# Patient Record
Sex: Male | Born: 1985 | Race: Black or African American | Hispanic: No | Marital: Married | Smoking: Current some day smoker
Health system: Southern US, Community
[De-identification: ages and names within clinical notes are randomized; demographics above are authoritative.]

---

## 2009-03-28 ENCOUNTER — Emergency Department (HOSPITAL_COMMUNITY): Admission: EM | Admit: 2009-03-28 | Discharge: 2009-03-28 | Payer: Self-pay | Admitting: Emergency Medicine

## 2020-06-20 ENCOUNTER — Emergency Department (HOSPITAL_BASED_OUTPATIENT_CLINIC_OR_DEPARTMENT_OTHER): Payer: No Typology Code available for payment source

## 2020-06-20 ENCOUNTER — Other Ambulatory Visit: Payer: Self-pay

## 2020-06-20 ENCOUNTER — Emergency Department (HOSPITAL_BASED_OUTPATIENT_CLINIC_OR_DEPARTMENT_OTHER)
Admission: EM | Admit: 2020-06-20 | Discharge: 2020-06-20 | Disposition: A | Discharge status: Home / Self Care | Payer: No Typology Code available for payment source | Attending: Emergency Medicine | Admitting: Emergency Medicine

## 2020-06-20 ENCOUNTER — Encounter (HOSPITAL_BASED_OUTPATIENT_CLINIC_OR_DEPARTMENT_OTHER): Payer: Self-pay | Admitting: Emergency Medicine

## 2020-06-20 DIAGNOSIS — Y990 Civilian activity done for income or pay: Secondary | ICD-10-CM | POA: Insufficient documentation

## 2020-06-20 DIAGNOSIS — W19XXXA Unspecified fall, initial encounter: Secondary | ICD-10-CM

## 2020-06-20 DIAGNOSIS — M25531 Pain in right wrist: Secondary | ICD-10-CM | POA: Diagnosis not present

## 2020-06-20 DIAGNOSIS — R2231 Localized swelling, mass and lump, right upper limb: Secondary | ICD-10-CM | POA: Insufficient documentation

## 2020-06-20 DIAGNOSIS — F17200 Nicotine dependence, unspecified, uncomplicated: Secondary | ICD-10-CM | POA: Insufficient documentation

## 2020-06-20 MED ORDER — HYDROCODONE-ACETAMINOPHEN 5-325 MG PO TABS: 2.0000 | ORAL_TABLET | ORAL | 0 refills | Status: AC | PRN

## 2020-06-20 MED ORDER — ACETAMINOPHEN 500 MG PO TABS
1000.0000 mg | ORAL_TABLET | Freq: Once | ORAL | Status: AC
  Administered 2020-06-20: 1000 mg via ORAL
  Filled 2020-06-20: qty 2

## 2020-06-20 MED ORDER — LIDOCAINE 5 % EX PTCH
1.0000 | MEDICATED_PATCH | CUTANEOUS | 0 refills | Status: AC
Start: 2020-06-20 — End: ?

## 2020-06-20 MED ORDER — NAPROXEN 500 MG PO TABS: 500.0000 mg | ORAL_TABLET | Freq: Two times a day (BID) | ORAL | 0 refills | Status: AC

## 2020-06-20 MED ORDER — HYDROCODONE-ACETAMINOPHEN 5-325 MG PO TABS: 1.0000 | ORAL_TABLET | Freq: Once | ORAL | Status: DC

## 2020-06-20 NOTE — ED Triage Notes (Addendum)
Pt arrives pov with c/o right hand/wrist injury. Swelling noted
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
## 2020-06-20 NOTE — Discharge Instructions (Signed)
We have placed you in a splint.  Your x-ray did not show any evidence of fracture, dislocation or fluid collection.  Take the medications as prescribed.  The Norco may make you sleepy.  Do not drive or operate heavy machinery while taking this medication.  If you notice any redness, warmth, increased swelling please return to the emergency department for evaluation.  Otherwise follow-up with orthopedics, hand surgery next week.  His contact information is listed in your discharge paperwork.  Call to schedule an appointment.  They also have a walk-in clinic if unable to schedule appointment.

## 2021-04-23 ENCOUNTER — Ambulatory Visit: Payer: Self-pay | Admitting: Family Medicine

## 2021-06-22 ENCOUNTER — Other Ambulatory Visit: Payer: Self-pay | Admitting: Nurse Practitioner

## 2021-06-22 ENCOUNTER — Other Ambulatory Visit: Payer: Self-pay

## 2021-06-22 ENCOUNTER — Ambulatory Visit
Admission: RE | Admit: 2021-06-22 | Discharge: 2021-06-22 | Disposition: A | Discharge status: Home / Self Care | Payer: Self-pay | Source: Ambulatory Visit | Attending: Nurse Practitioner | Admitting: Nurse Practitioner

## 2021-06-22 DIAGNOSIS — W19XXXA Unspecified fall, initial encounter: Secondary | ICD-10-CM

## 2021-06-22 DIAGNOSIS — R52 Pain, unspecified: Secondary | ICD-10-CM

## 2022-09-05 ENCOUNTER — Encounter: Payer: Self-pay | Admitting: *Deleted

## 2022-11-28 IMAGING — CR DG ELBOW COMPLETE 3+V*L*
4 series · 4 of 4 positions shown · non-contrast
Comparison: None.

CLINICAL DATA: Pain after fall.

EXAM:
LEFT ELBOW - COMPLETE 3+ VIEW

[x elbow ap left]
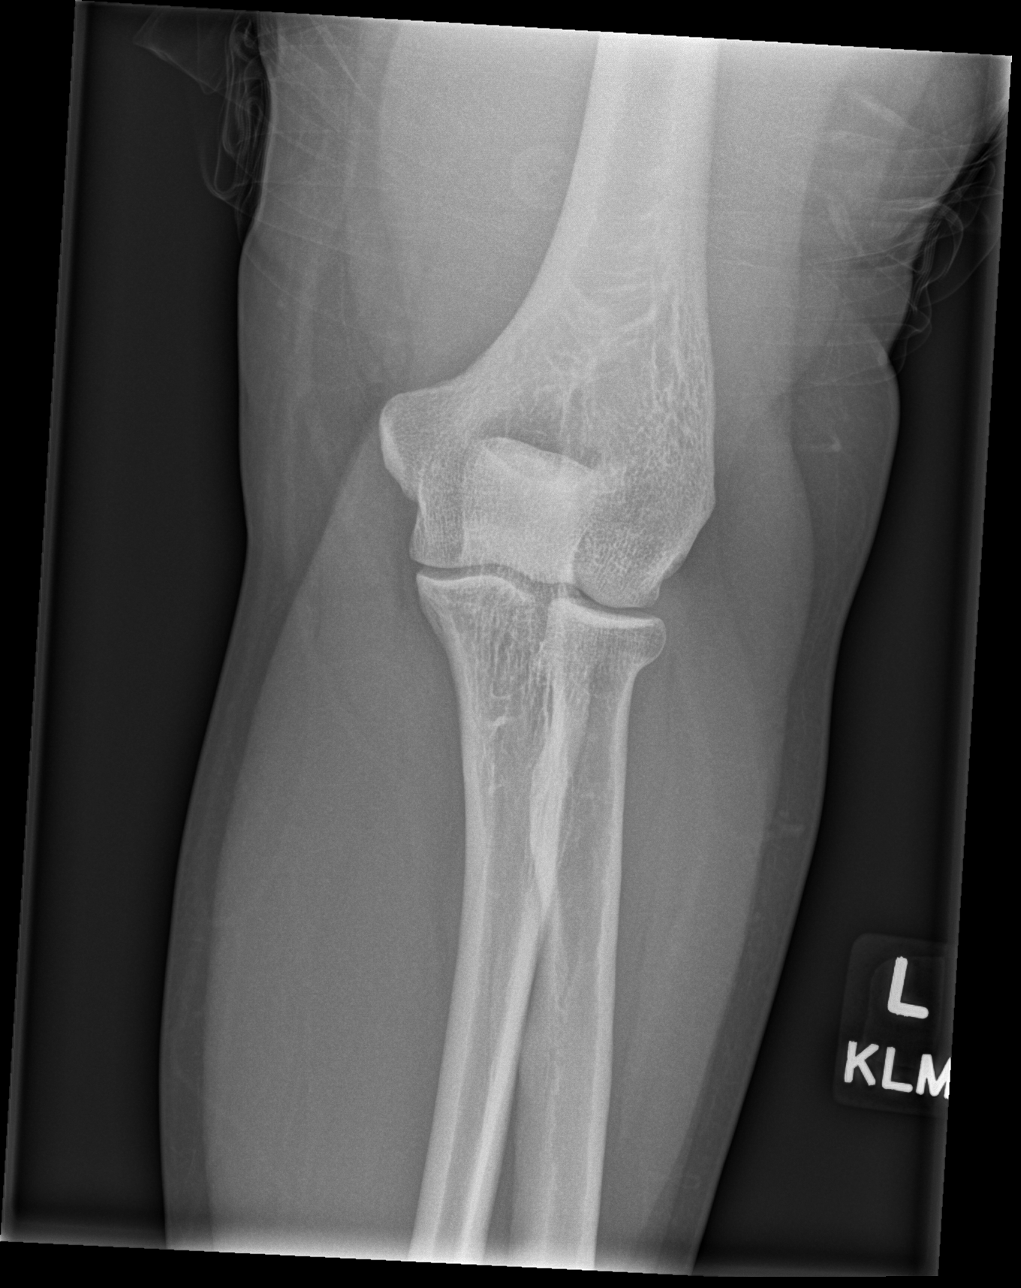

[x elbow obl left (1 of 2)]
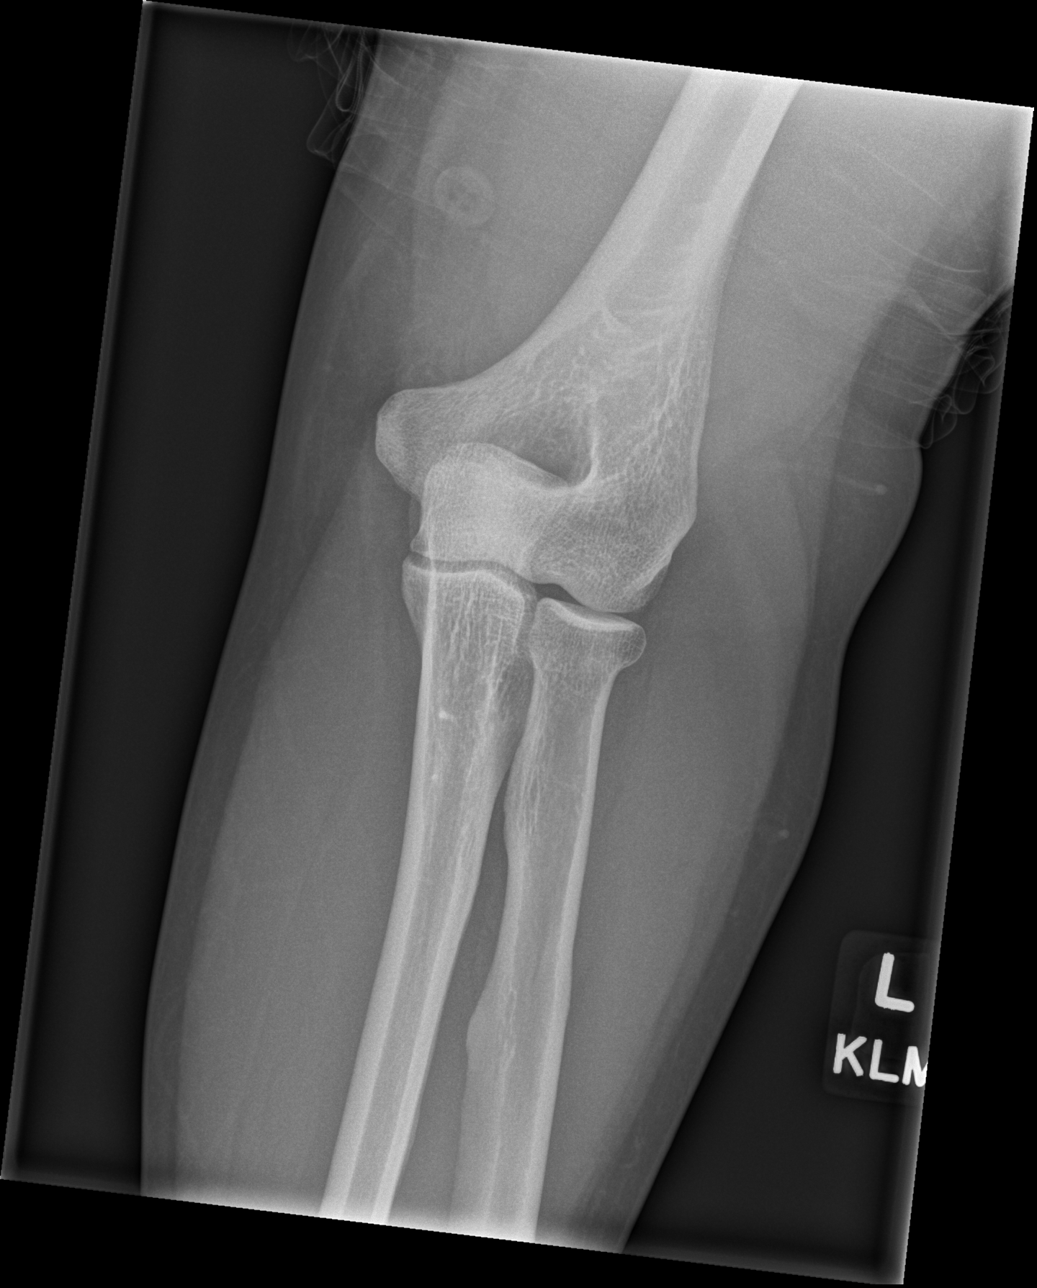

[x elbow obl left (2 of 2)]
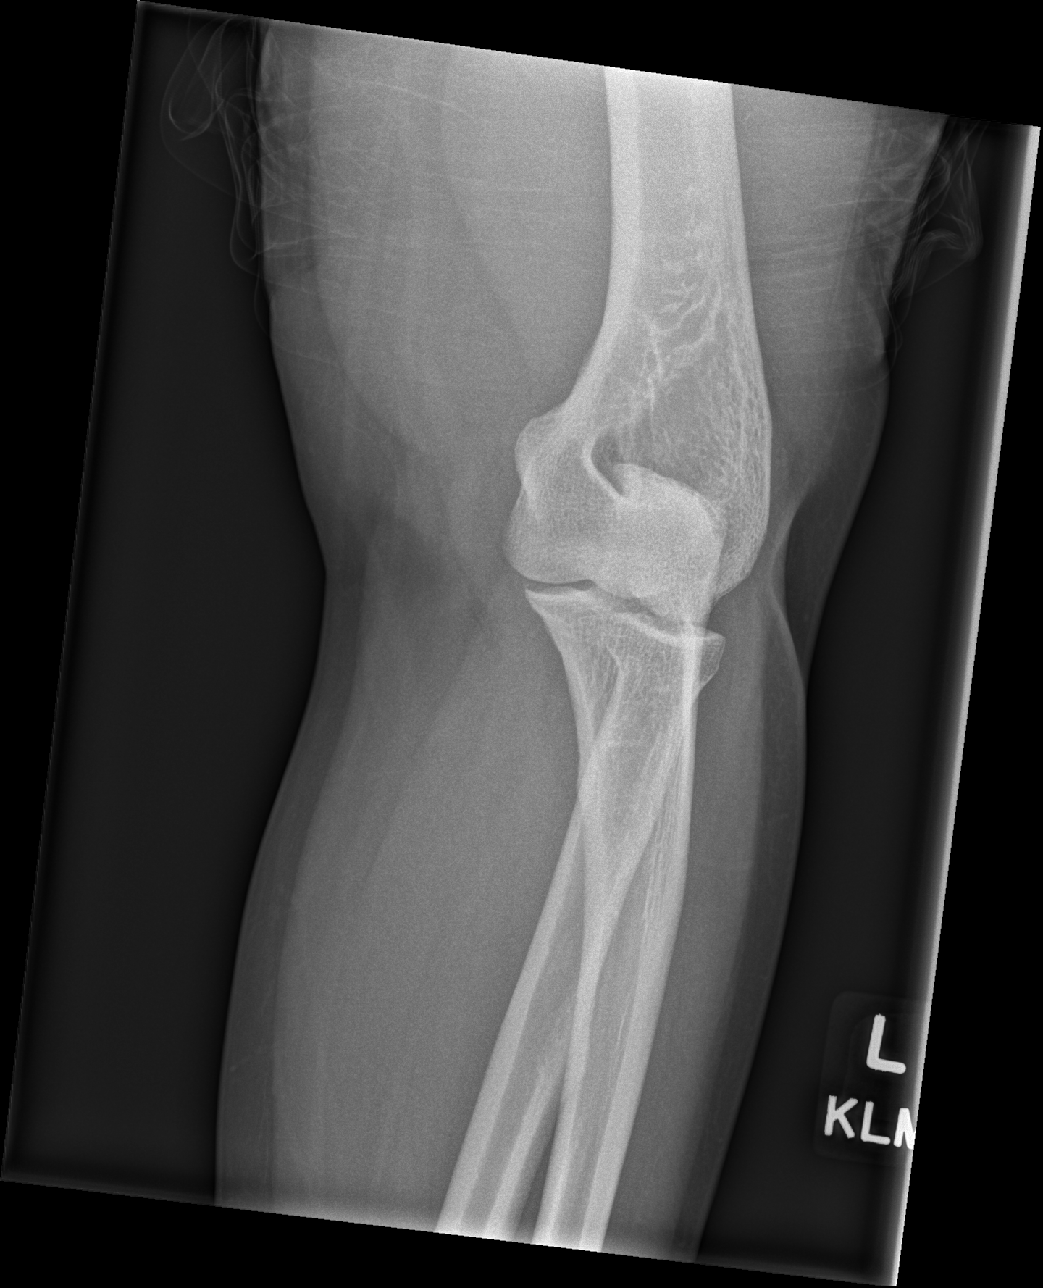

[x elbow lat left]
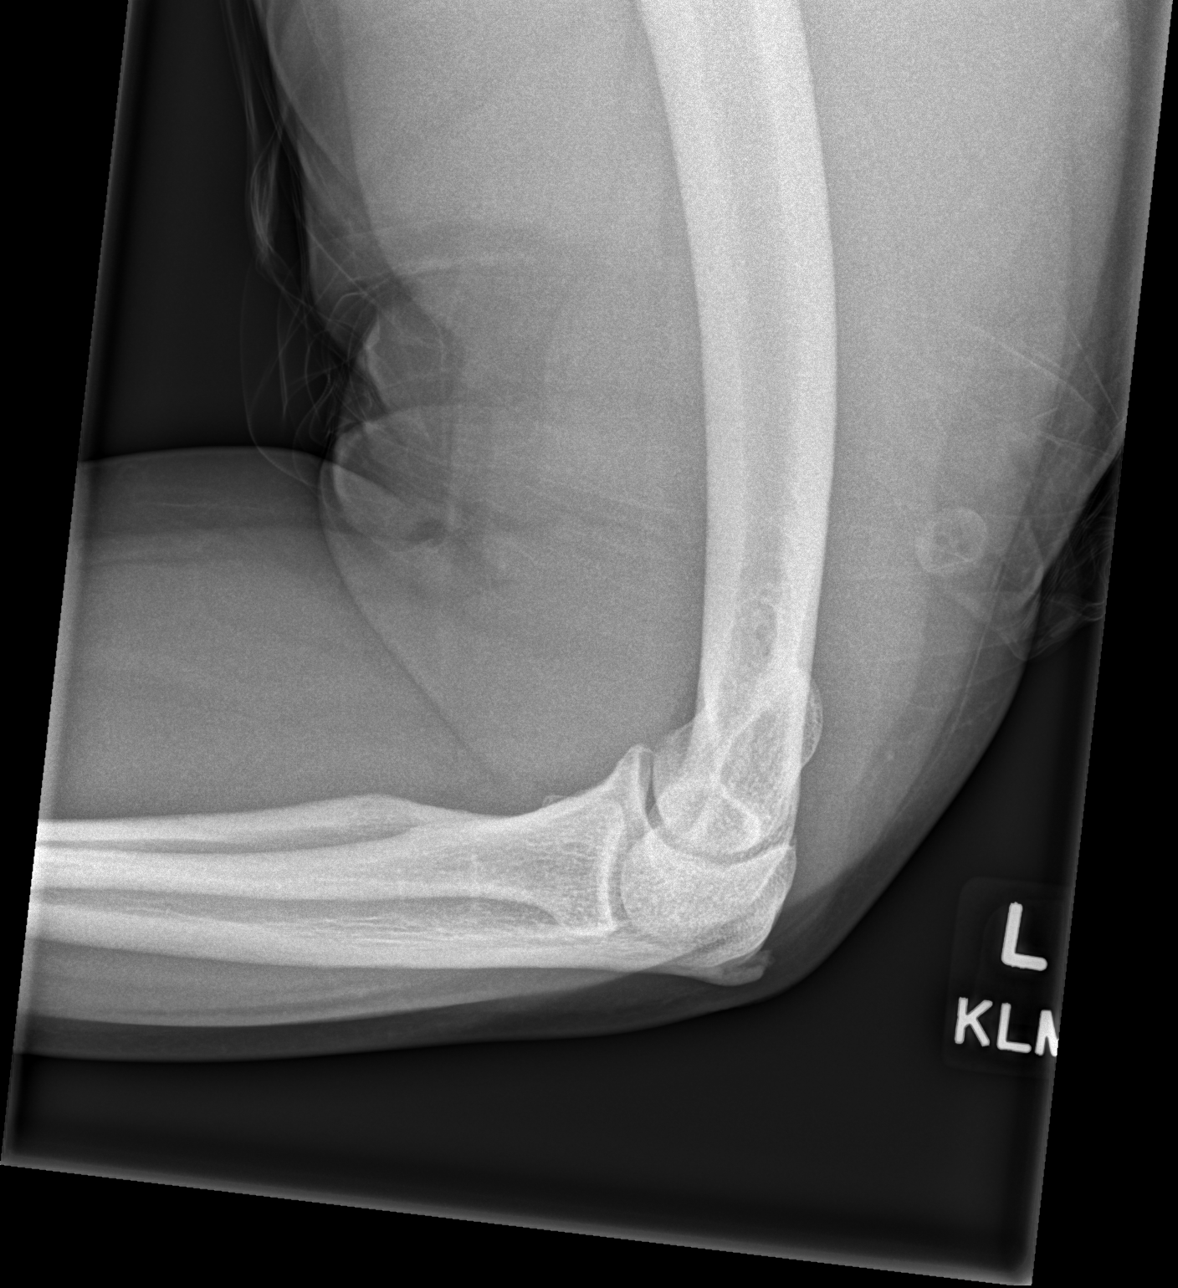

[4 of 4 positions shown; findings below may reference images not displayed]

FINDINGS: There is no evidence of fracture, dislocation, or joint effusion.
There is no evidence of arthropathy. Olecranon spur is present. Soft
tissues are unremarkable.
IMPRESSION: Negative.

## 2022-11-28 IMAGING — CR DG WRIST COMPLETE 3+V*L*
4 series · 4 of 4 positions shown · non-contrast
Comparison: None.

CLINICAL DATA: Pain after fall.

EXAM:
LEFT WRIST - COMPLETE 3+ VIEW

[x wrist pa left]
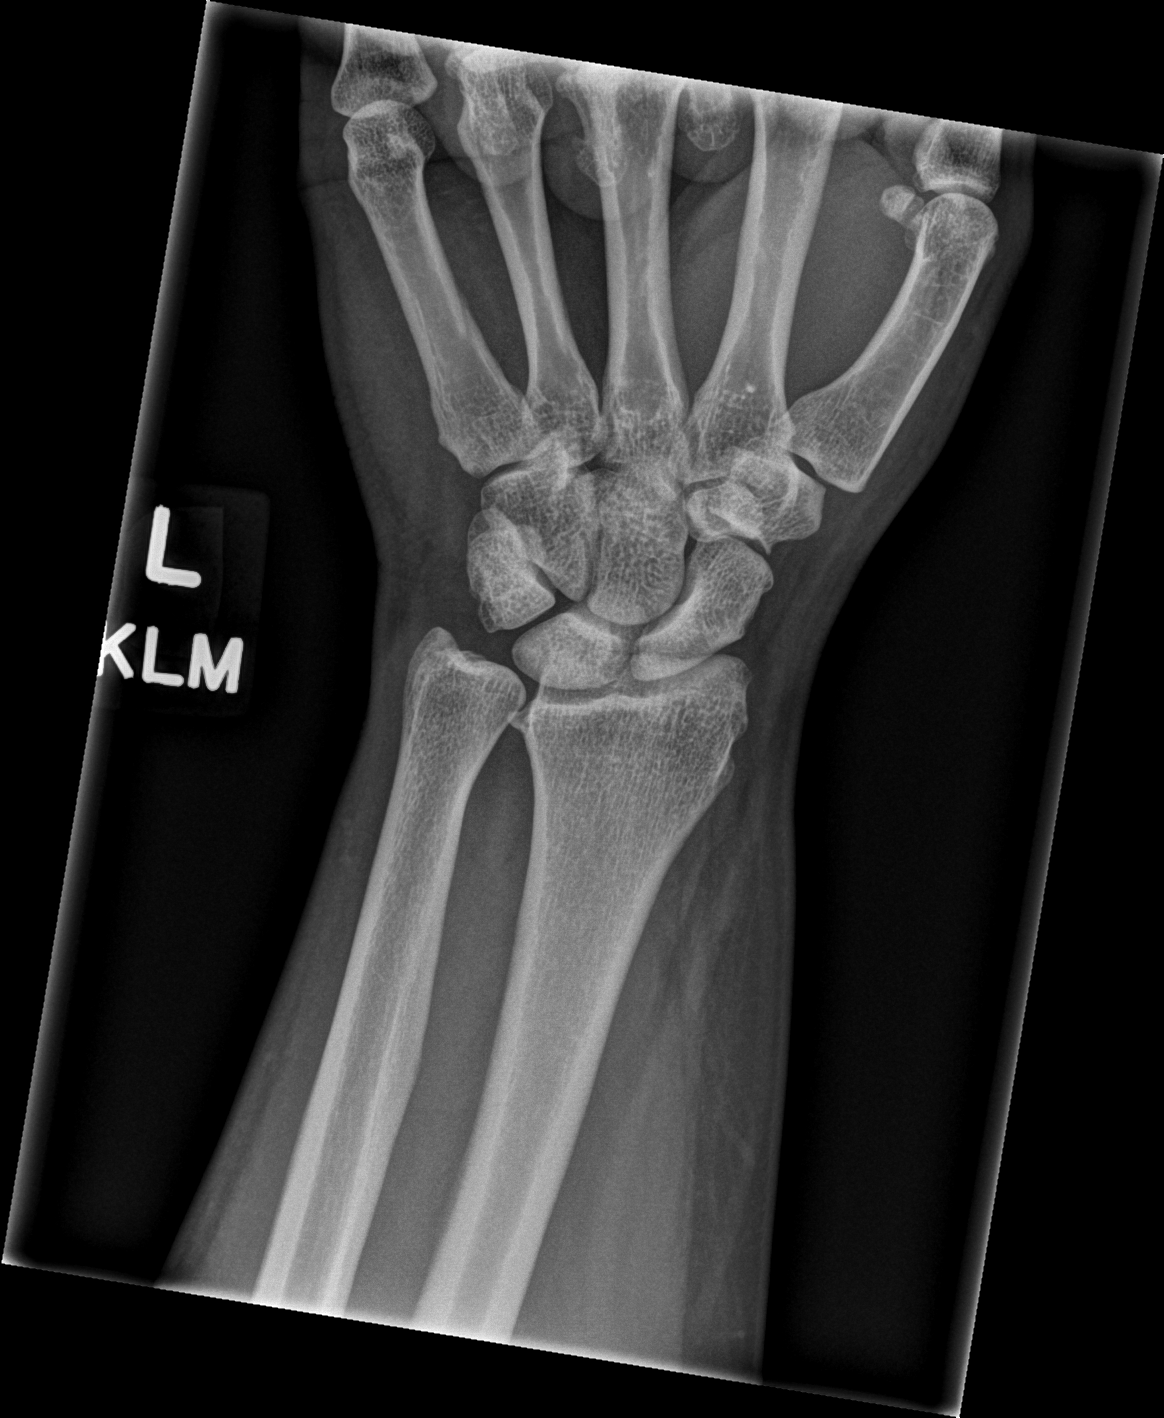

[x wrist obl left]
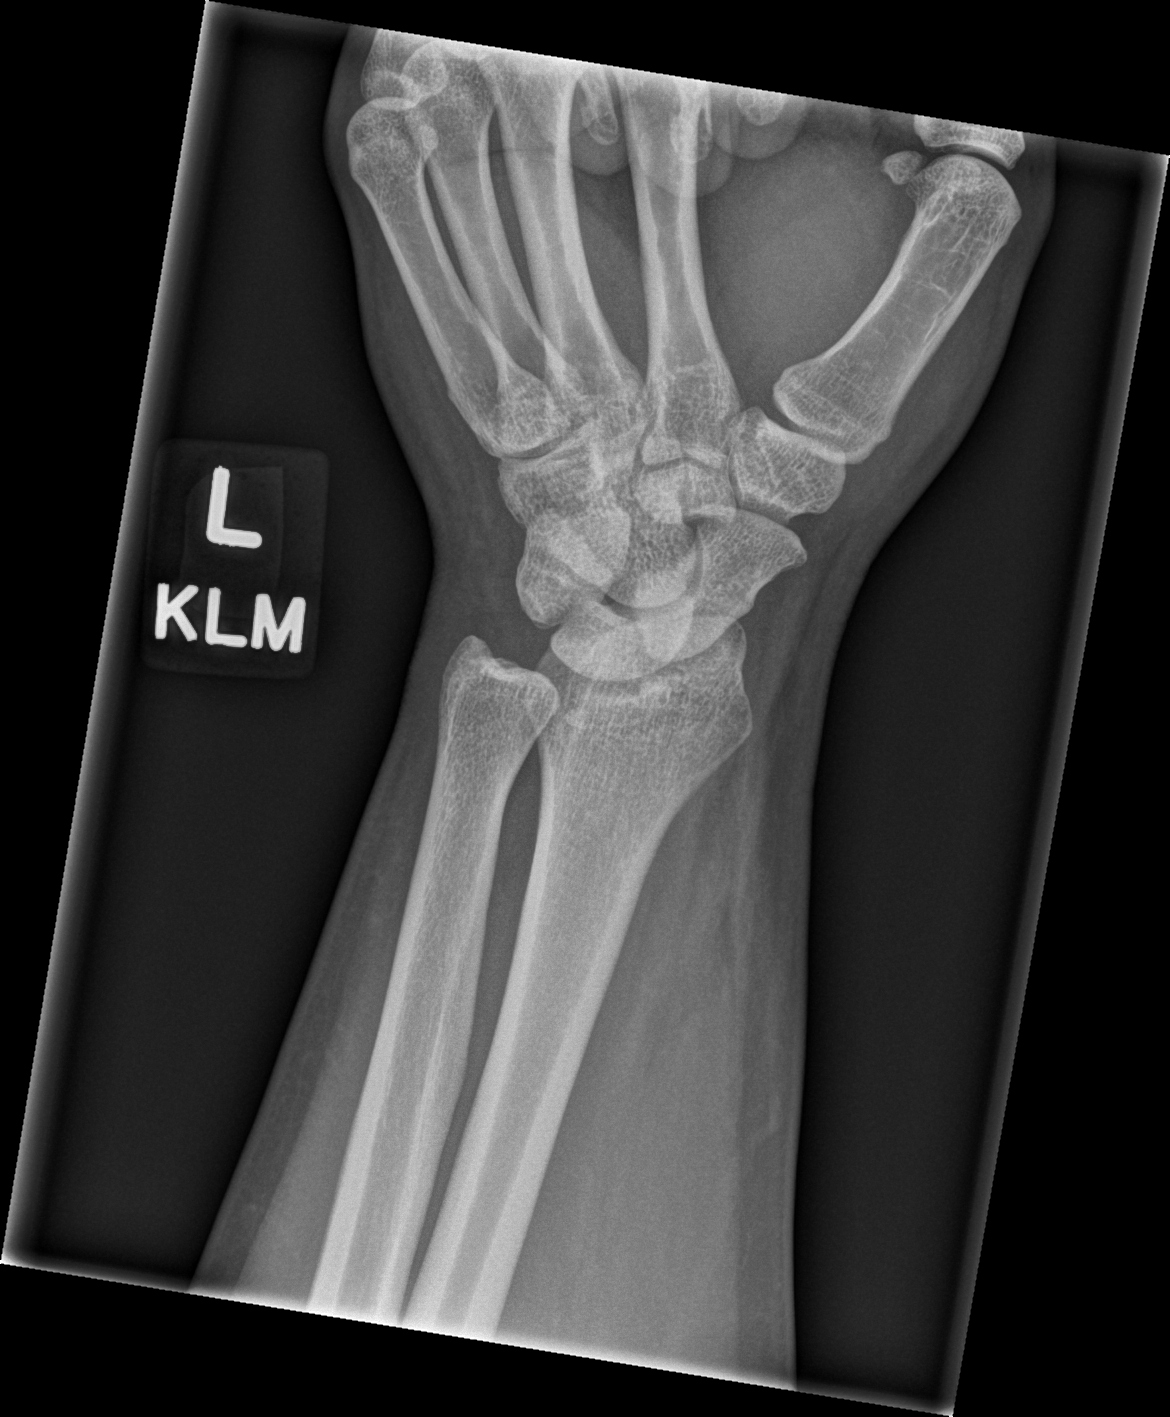

[x wrist navicular view left]
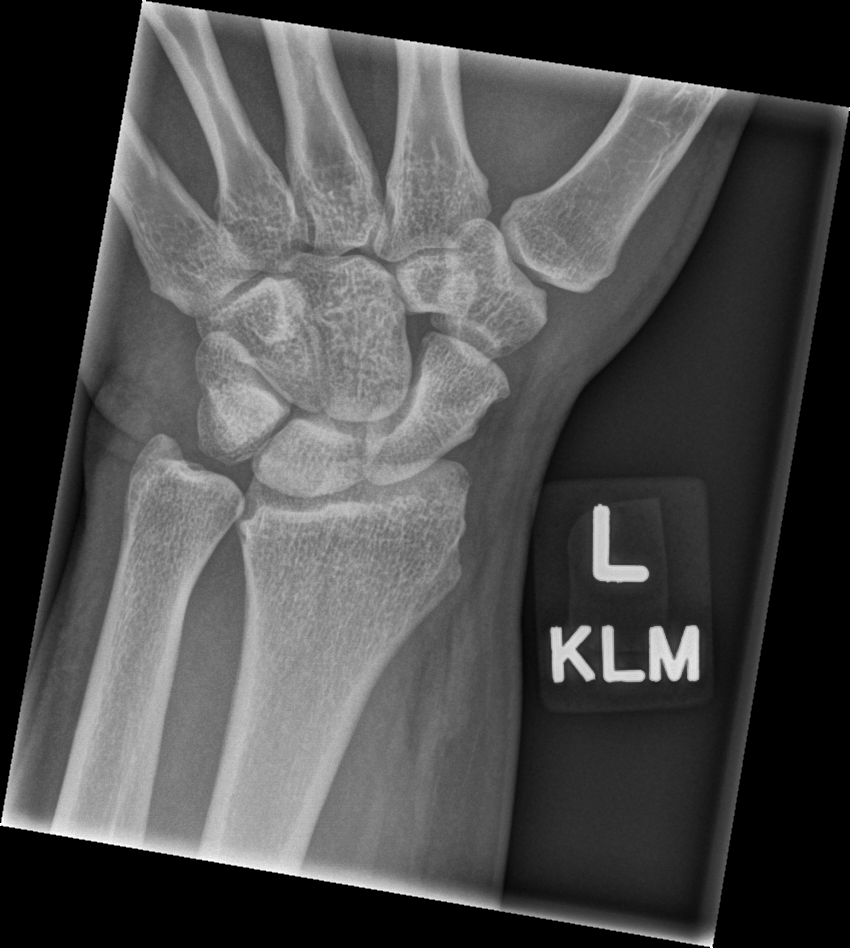

[x wrist lat left]
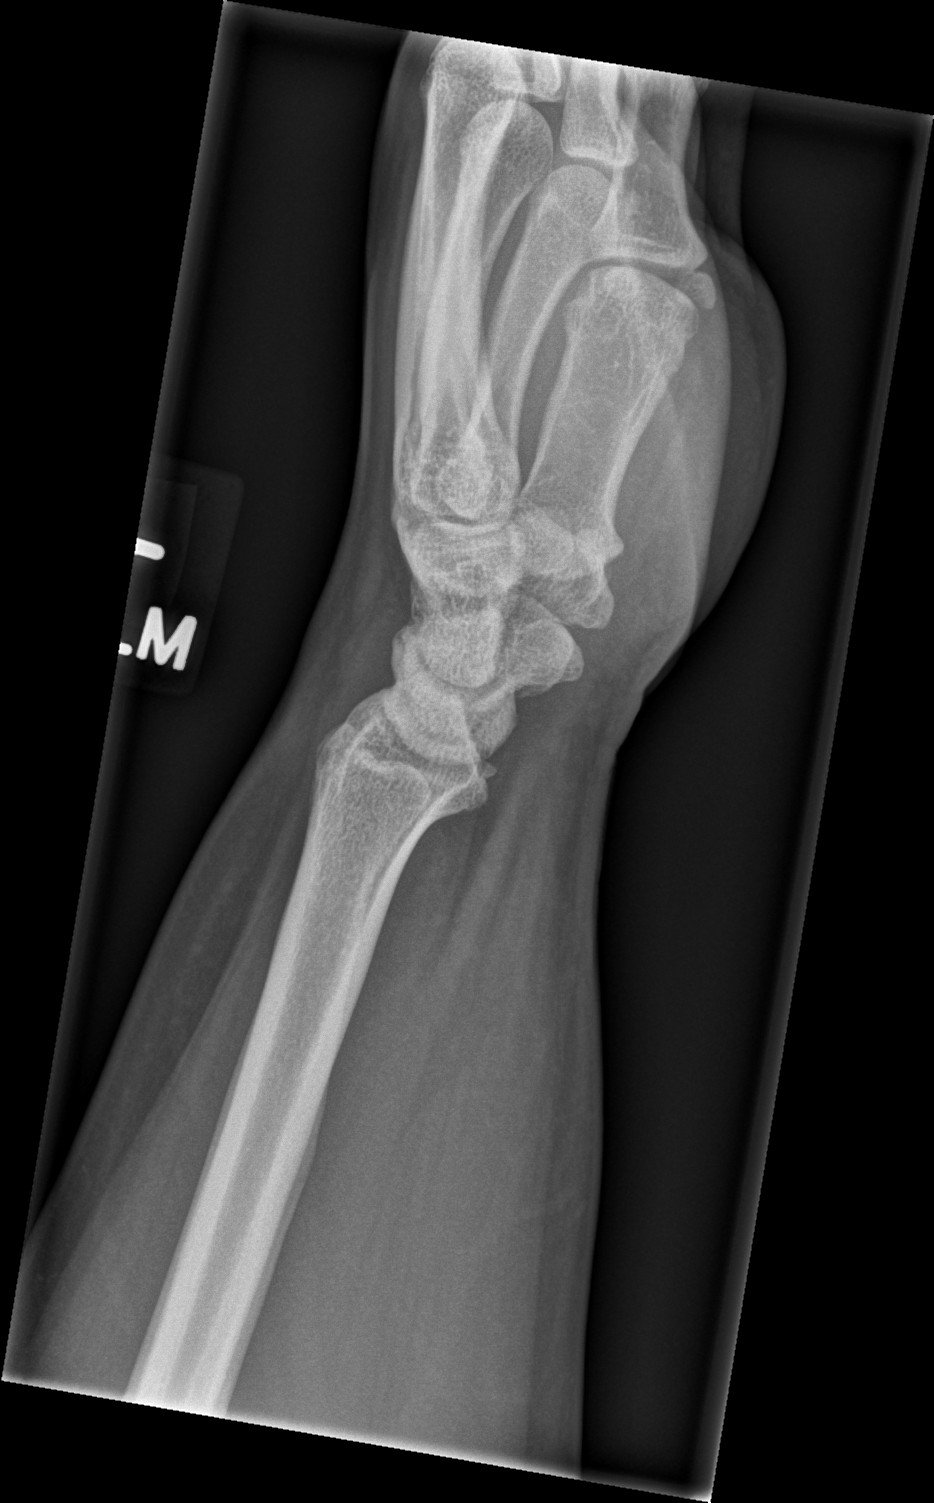

[4 of 4 positions shown; findings below may reference images not displayed]

FINDINGS: There is no evidence of fracture or dislocation. There is no
evidence of arthropathy or other focal bone abnormality. Soft
tissues are unremarkable.
IMPRESSION: Negative.

## 2023-09-16 ENCOUNTER — Other Ambulatory Visit: Payer: Self-pay

## 2023-09-16 ENCOUNTER — Emergency Department (HOSPITAL_BASED_OUTPATIENT_CLINIC_OR_DEPARTMENT_OTHER): Admitting: Radiology

## 2023-09-16 ENCOUNTER — Emergency Department (HOSPITAL_BASED_OUTPATIENT_CLINIC_OR_DEPARTMENT_OTHER)

## 2023-09-16 ENCOUNTER — Encounter (HOSPITAL_BASED_OUTPATIENT_CLINIC_OR_DEPARTMENT_OTHER): Payer: Self-pay

## 2023-09-16 ENCOUNTER — Emergency Department (HOSPITAL_BASED_OUTPATIENT_CLINIC_OR_DEPARTMENT_OTHER)
Admission: EM | Admit: 2023-09-16 | Discharge: 2023-09-16 | Disposition: A | Discharge status: Home / Self Care | Attending: Emergency Medicine | Admitting: Emergency Medicine

## 2023-09-16 DIAGNOSIS — S0091XA Abrasion of unspecified part of head, initial encounter: Secondary | ICD-10-CM | POA: Diagnosis not present

## 2023-09-16 DIAGNOSIS — S5002XA Contusion of left elbow, initial encounter: Secondary | ICD-10-CM | POA: Insufficient documentation

## 2023-09-16 DIAGNOSIS — S60212A Contusion of left wrist, initial encounter: Secondary | ICD-10-CM | POA: Insufficient documentation

## 2023-09-16 DIAGNOSIS — Y92410 Unspecified street and highway as the place of occurrence of the external cause: Secondary | ICD-10-CM | POA: Insufficient documentation

## 2023-09-16 DIAGNOSIS — S8002XA Contusion of left knee, initial encounter: Secondary | ICD-10-CM | POA: Insufficient documentation

## 2023-09-16 DIAGNOSIS — S39012A Strain of muscle, fascia and tendon of lower back, initial encounter: Secondary | ICD-10-CM | POA: Diagnosis not present

## 2023-09-16 DIAGNOSIS — Y990 Civilian activity done for income or pay: Secondary | ICD-10-CM | POA: Insufficient documentation

## 2023-09-16 DIAGNOSIS — S0990XA Unspecified injury of head, initial encounter: Secondary | ICD-10-CM | POA: Diagnosis present

## 2023-09-16 DIAGNOSIS — T07XXXA Unspecified multiple injuries, initial encounter: Secondary | ICD-10-CM

## 2023-09-16 MED ORDER — ACETAMINOPHEN 500 MG PO TABS
1000.0000 mg | ORAL_TABLET | Freq: Once | ORAL | Status: AC
  Administered 2023-09-16: 1000 mg via ORAL
  Filled 2023-09-16: qty 2
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
## 2023-09-16 NOTE — ED Notes (Signed)
 To CT

## 2023-09-16 NOTE — ED Notes (Signed)
 Did not complain of pain in the left knee but knee appears red and bruised.
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
## 2023-09-22 ENCOUNTER — Ambulatory Visit
Admission: RE | Admit: 2023-09-22 | Discharge: 2023-09-22 | Disposition: A | Discharge status: Home / Self Care | Payer: Worker's Compensation | Source: Ambulatory Visit | Attending: Nurse Practitioner | Admitting: Nurse Practitioner

## 2023-09-22 ENCOUNTER — Other Ambulatory Visit: Payer: Self-pay | Admitting: Nurse Practitioner

## 2023-09-22 DIAGNOSIS — M25561 Pain in right knee: Secondary | ICD-10-CM
